# Patient Record
Sex: Female | Born: 1965 | Race: Black or African American | Hispanic: No | Marital: Married | Smoking: Never smoker
Health system: Southern US, Community
[De-identification: ages and names within clinical notes are randomized; demographics above are authoritative.]

## PROBLEM LIST (undated history)

## (undated) DIAGNOSIS — I10 Essential (primary) hypertension: Secondary | ICD-10-CM

---

## 1994-06-22 HISTORY — PX: TUBAL LIGATION: SHX77

## 2004-08-11 ENCOUNTER — Emergency Department: Payer: Self-pay | Admitting: General Practice

## 2008-05-06 ENCOUNTER — Emergency Department: Payer: Self-pay | Admitting: Emergency Medicine

## 2009-07-23 ENCOUNTER — Emergency Department: Payer: Self-pay | Admitting: Emergency Medicine

## 2015-01-28 ENCOUNTER — Other Ambulatory Visit: Payer: Self-pay | Admitting: Orthopedic Surgery

## 2015-01-28 DIAGNOSIS — M25551 Pain in right hip: Secondary | ICD-10-CM

## 2015-01-28 DIAGNOSIS — M898X5 Other specified disorders of bone, thigh: Secondary | ICD-10-CM

## 2015-01-28 DIAGNOSIS — G8929 Other chronic pain: Secondary | ICD-10-CM

## 2015-01-31 ENCOUNTER — Other Ambulatory Visit: Payer: Self-pay | Admitting: Orthopedic Surgery

## 2015-01-31 DIAGNOSIS — G8929 Other chronic pain: Secondary | ICD-10-CM

## 2015-01-31 DIAGNOSIS — M7989 Other specified soft tissue disorders: Secondary | ICD-10-CM

## 2015-01-31 DIAGNOSIS — M898X5 Other specified disorders of bone, thigh: Secondary | ICD-10-CM

## 2015-01-31 DIAGNOSIS — M25551 Pain in right hip: Secondary | ICD-10-CM

## 2015-02-01 ENCOUNTER — Ambulatory Visit
Admission: RE | Admit: 2015-02-01 | Discharge: 2015-02-01 | Disposition: A | Discharge status: Home / Self Care | Payer: BLUE CROSS/BLUE SHIELD | Source: Ambulatory Visit | Attending: Orthopedic Surgery | Admitting: Orthopedic Surgery

## 2015-02-01 ENCOUNTER — Other Ambulatory Visit
Admission: RE | Admit: 2015-02-01 | Discharge: 2015-02-01 | Disposition: A | Discharge status: Home / Self Care | Payer: BLUE CROSS/BLUE SHIELD | Source: Ambulatory Visit | Attending: Orthopedic Surgery | Admitting: Orthopedic Surgery

## 2015-02-01 DIAGNOSIS — M7989 Other specified soft tissue disorders: Secondary | ICD-10-CM

## 2015-02-01 DIAGNOSIS — M25551 Pain in right hip: Secondary | ICD-10-CM | POA: Insufficient documentation

## 2015-02-01 DIAGNOSIS — G8929 Other chronic pain: Secondary | ICD-10-CM | POA: Diagnosis present

## 2015-02-01 DIAGNOSIS — M7061 Trochanteric bursitis, right hip: Secondary | ICD-10-CM | POA: Diagnosis not present

## 2015-02-01 DIAGNOSIS — M799 Soft tissue disorder, unspecified: Secondary | ICD-10-CM | POA: Diagnosis present

## 2015-02-01 DIAGNOSIS — M898X5 Other specified disorders of bone, thigh: Secondary | ICD-10-CM

## 2015-02-01 LAB — CHLORIDE: CHLORIDE: 98 mmol/L — AB (ref 101–111)

## 2015-02-01 LAB — BUN: BUN: 15 mg/dL (ref 6–20)

## 2015-02-01 LAB — GLUCOSE, RANDOM: Glucose, Bld: 101 mg/dL — ABNORMAL HIGH (ref 65–99)

## 2015-02-01 LAB — POTASSIUM: Potassium: 3.1 mmol/L — ABNORMAL LOW (ref 3.5–5.1)

## 2015-02-01 LAB — CREATININE, SERUM
Creatinine, Ser: 0.82 mg/dL (ref 0.44–1.00)
GFR calc non Af Amer: >60 mL/min (ref >60)

## 2015-02-01 LAB — SODIUM: Sodium: 138 mmol/L (ref 135–145)

## 2015-02-01 MED ORDER — GADOBENATE DIMEGLUMINE 529 MG/ML IV SOLN
20.0000 mL | Freq: Once | INTRAVENOUS | Status: AC | PRN
  Administered 2015-02-01: 20 mL via INTRAVENOUS

## 2019-01-30 ENCOUNTER — Emergency Department (HOSPITAL_COMMUNITY)
Admission: EM | Admit: 2019-01-30 | Discharge: 2019-01-30 | Disposition: A | Discharge status: Home / Self Care | Payer: BLUE CROSS/BLUE SHIELD | Attending: Emergency Medicine | Admitting: Emergency Medicine

## 2019-01-30 ENCOUNTER — Encounter (HOSPITAL_COMMUNITY): Payer: Self-pay | Admitting: *Deleted

## 2019-01-30 ENCOUNTER — Emergency Department (HOSPITAL_COMMUNITY): Payer: BLUE CROSS/BLUE SHIELD

## 2019-01-30 ENCOUNTER — Other Ambulatory Visit: Payer: Self-pay

## 2019-01-30 DIAGNOSIS — R0789 Other chest pain: Secondary | ICD-10-CM | POA: Diagnosis present

## 2019-01-30 DIAGNOSIS — I10 Essential (primary) hypertension: Secondary | ICD-10-CM | POA: Diagnosis not present

## 2019-01-30 DIAGNOSIS — U071 COVID-19: Secondary | ICD-10-CM | POA: Insufficient documentation

## 2019-01-30 DIAGNOSIS — R0600 Dyspnea, unspecified: Secondary | ICD-10-CM | POA: Insufficient documentation

## 2019-01-30 HISTORY — DX: Essential (primary) hypertension: I10

## 2019-01-30 LAB — CBC WITH DIFFERENTIAL/PLATELET
Abs Immature Granulocytes: 0.02 10*3/uL (ref 0.00–0.07)
Basophils Absolute: 0 10*3/uL (ref 0.0–0.1)
Basophils Relative: 1 %
Eosinophils Absolute: 0.2 10*3/uL (ref 0.0–0.5)
Eosinophils Relative: 3 %
HCT: 39.5 % (ref 36.0–46.0)
Hemoglobin: 12.2 g/dL (ref 12.0–15.0)
Immature Granulocytes: 0 %
Lymphocytes Relative: 21 %
Lymphs Abs: 1.4 10*3/uL (ref 0.7–4.0)
MCH: 21.4 pg — ABNORMAL LOW (ref 26.0–34.0)
MCHC: 30.9 g/dL (ref 30.0–36.0)
MCV: 69.3 fL — ABNORMAL LOW (ref 80.0–100.0)
Monocytes Absolute: 0.6 10*3/uL (ref 0.1–1.0)
Monocytes Relative: 9 %
Neutro Abs: 4.3 10*3/uL (ref 1.7–7.7)
Neutrophils Relative %: 66 %
Platelets: 303 10*3/uL (ref 150–400)
RBC: 5.7 MIL/uL — ABNORMAL HIGH (ref 3.87–5.11)
RDW: 17.3 % — ABNORMAL HIGH (ref 11.5–15.5)
WBC: 6.4 10*3/uL (ref 4.0–10.5)
nRBC: 0 % (ref 0.0–0.2)

## 2019-01-30 LAB — BASIC METABOLIC PANEL
Anion gap: 10 (ref 5–15)
BUN: 14 mg/dL (ref 6–20)
CO2: 27 mmol/L (ref 22–32)
Calcium: 9.1 mg/dL (ref 8.9–10.3)
Chloride: 103 mmol/L (ref 98–111)
Creatinine, Ser: 0.67 mg/dL (ref 0.44–1.00)
GFR calc Af Amer: >60 mL/min (ref >60)
GFR calc non Af Amer: >60 mL/min (ref >60)
Glucose, Bld: 87 mg/dL (ref 70–99)
Potassium: 3.7 mmol/L (ref 3.5–5.1)
Sodium: 140 mmol/L (ref 135–145)

## 2019-01-30 LAB — TROPONIN I (HIGH SENSITIVITY)
Troponin I (High Sensitivity): <2 ng/L (ref <18)
Troponin I (High Sensitivity): <2 ng/L (ref <18)

## 2019-01-30 LAB — SARS CORONAVIRUS 2 BY RT PCR (HOSPITAL ORDER, PERFORMED IN ~~LOC~~ HOSPITAL LAB): SARS Coronavirus 2: POSITIVE — AB

## 2019-01-30 MED ORDER — IOHEXOL 350 MG/ML SOLN
100.0000 mL | Freq: Once | INTRAVENOUS | Status: AC | PRN
  Administered 2019-01-30: 100 mL via INTRAVENOUS

## 2019-01-30 NOTE — ED Provider Notes (Signed)
[facility]ENCY [facility]PARTMENT Provider Note   CSN: [id number] Arrival date & time: 01/30/19  1019     History   Chief Complaint Chief Complaint  Patient presents with   Chest Pain    HPI Michelle Burch is a 53 y.o. female.     HPI  Pt was seen at 1120. Per pt, c/o gradual onset and persistence of constant generalized chest "tightness" since yesterday approximately 2000. Pt states she has had waxing and waning generalized chest "tightness" over the past 3 weeks, just "worse" yesterday evening. Pt describes "worse" as "maybe wheezing" and "made be breathe deep." Pt states she had loss of taste/smell and this same chest pain 3 weeks ago (01/10/19) and a co-worker tested positive for COVID. Pt states she went for testing 7/28 and 7/30 was told she was +COVID. Pt states she may have had one episode (ie: one meal) of "I might have actually tasted the fish I was eating) but overall states she has had continued symptoms for 3 weeks. Pt states she is currently out of quarantine (13 days after +testing), but her PMD told her to come to the ED for further testing regarding her chest pain. Pt also states that her PMD spoke of retesting her for COVID. Denies any further positive contacts. Denies fevers at any time over the past 3 weeks, especially the past 3 days. Denies palpitations, no SOB/cough, no abd pain, no fevers, no rash, no back pain, no N/V/D, no visual changes, no focal motor weakness, no tingling/numbness in extremities, no ataxia, no slurred speech, no facial droop.       Past Medical History:  Diagnosis Date   Hypertension     There are no active problems to display for this patient.   History reviewed. No pertinent surgical history.   OB History   No obstetric history on file.      Home Medications    Prior to Admission medications   Medication Sig Start Date End Date Taking? Authorizing Provider  lisinopril-hydrochlorothiazide (ZESTORETIC) 20-25 MG tablet  Take 1 tablet by mouth daily. 01/08/19   [provider]    Family History History reviewed. No pertinent family history.  Social History Social History   Tobacco Use   Smoking status: Never Smoker   Smokeless tobacco: Never Used  Substance Use Topics   Alcohol use: Yes    Comment: occ   Drug use: Never     Allergies   Patient has no known allergies.   Review of Systems Review of Systems ROS: Statement: All systems negative except as marked or noted in the HPI; Constitutional: Negative for fever and chills. +loss of taste/smell.; ; Eyes: Negative for eye pain, redness and discharge. ; ; ENMT: Negative for ear pain, hoarseness, nasal congestion, sinus pressure and sore throat. ; ; Cardiovascular: +CP. Negative for palpitations, diaphoresis, dyspnea and peripheral edema. ; ; Respiratory: +"maybe wheezing," "breathing deep." Negative for cough and stridor. ; ; Gastrointestinal: Negative for nausea, vomiting, diarrhea, abdominal pain, blood in stool, hematemesis, jaundice and rectal bleeding. . ; ; Genitourinary: Negative for dysuria, flank pain and hematuria. ; ; Musculoskeletal: Negative for back pain and neck pain. Negative for swelling and trauma.; ; Skin: Negative for pruritus, rash, abrasions, blisters, bruising and skin lesion.; ; Neuro: Negative for headache, lightheadedness and neck stiffness. Negative for weakness, altered level of consciousness, altered mental status, extremity weakness, paresthesias, involuntary movement, seizure and syncope.       Physical Exam Updated Vital Signs BP  118/79    Pulse 64    Temp 98.2 F (36.8 C) (Oral)    Resp 15    Ht 5' (1.524 m)    Wt 98 kg    LMP  (LMP Unknown)    SpO2 98%    BMI 42.18 kg/m   Physical Exam 1125: Physical examination:  Nursing notes reviewed; Vital signs and O2 SAT reviewed;  Constitutional: Well developed, Well nourished, Well hydrated, In no acute distress; Head:  Normocephalic, atraumatic; Eyes: EOMI,  PERRL, No scleral icterus; ENMT: TM's clear bilat. +edemetous nasal turbinates bilat with clear rhinorrhea. Mouth and pharynx without lesions. No tonsillar exudates. No intra-oral edema. No submandibular or sublingual edema. No hoarse voice, no drooling, no stridor. No pain with manipulation of larynx. No trismus. Mouth and pharynx normal, Mucous membranes moist; Neck: Supple, Full range of motion, No lymphadenopathy; Cardiovascular: Regular rate and rhythm, No gallop; Respiratory: Breath sounds clear & equal bilaterally, No wheezes.  Speaking full sentences with ease, Normal respiratory effort/excursion; Chest: Nontender, Movement normal; Abdomen: Soft, Nontender, Nondistended, Normal bowel sounds; Genitourinary: No CVA tenderness; Extremities: Peripheral pulses normal, No tenderness, No edema, No calf edema or asymmetry.; Neuro: AA&Ox3, Major CN grossly intact. No facial droop. Speech clear. No gross focal motor or sensory deficits in extremities.; Skin: Color normal, Warm, Dry.     ED Treatments / Results  Labs (all labs ordered are listed, but only abnormal results are displayed)   EKG EKG Interpretation  Date/Time:  Monday January 30 2019 11:12:07 EDT Ventricular Rate:  71 PR Interval:    QRS Duration: 89 QT Interval:  364 QTC Calculation: 396 R Axis:   29 Text Interpretation:  Sinus rhythm Low voltage, precordial leads Baseline wander No old tracing to compare Confirmed by Samuel JesterMcManus, Nadav Swindell [phone]([id number]) on 01/30/2019 11:45:03 AM   Radiology   Procedures Procedures (including critical care time)  Medications Ordered in ED Medications - No data to display   Initial Impression / Assessment and Plan / ED Course  I have reviewed the triage vital signs and the nursing notes.  Pertinent labs & imaging results that were available during my care of the patient were reviewed by me and considered in my medical decision making (see chart for details).     MDM Reviewed: previous chart,  nursing note and vitals Reviewed previous: labs Interpretation: labs, ECG, x-ray and CT scan    Results for orders placed or performed during the hospital encounter of 01/30/19  SARS Coronavirus 2 [facility](Hospital order, Performed [facility]) Nasopharyngeal Nasopharyngeal Swab   Specimen: Nasopharyngeal Swab  Result Value Ref Range   SARS Coronavirus 2 POSITIVE (A) NEGATIVE  Basic metabolic panel  Result Value Ref Range   Sodium 140 135 - 145 mmol/L   Potassium 3.7 3.5 - 5.1 mmol/L   Chloride 103 98 - 111 mmol/L   CO2 27 22 - 32 mmol/L   Glucose, Bld 87 70 - 99 mg/dL   BUN 14 6 - 20 mg/dL   Creatinine, Ser 6.040.67 0.44 - 1.00 mg/dL   Calcium 9.1 8.9 - 54.010.3 mg/dL   GFR calc non Af Amer >60 >60 mL/min   GFR calc Af Amer >60 >60 mL/min   Anion gap 10 5 - 15  CBC with Differential  Result Value Ref Range   WBC 6.4 4.0 - 10.5 K/uL   RBC 5.70 (H) 3.87 - 5.11 MIL/uL   Hemoglobin 12.2 12.0 - 15.0 g/dL   HCT 98.139.5 19.136.0 - 47.846.0 %  MCV 69.3 (L) 80.0 - 100.0 fL   MCH 21.4 (L) 26.0 - 34.0 pg   MCHC 30.9 30.0 - 36.0 g/dL   RDW 17.3 (H) 11.5 - 15.5 %   Platelets 303 150 - 400 K/uL   nRBC 0.0 0.0 - 0.2 %   Neutrophils Relative % 66 %   Neutro Abs 4.3 1.7 - 7.7 K/uL   Lymphocytes Relative 21 %   Lymphs Abs 1.4 0.7 - 4.0 K/uL   Monocytes Relative 9 %   Monocytes Absolute 0.6 0.1 - 1.0 K/uL   Eosinophils Relative 3 %   Eosinophils Absolute 0.2 0.0 - 0.5 K/uL   Basophils Relative 1 %   Basophils Absolute 0.0 0.0 - 0.1 K/uL   RBC Morphology MICROCYTOSIS    Immature Granulocytes 0 %   Abs Immature Granulocytes 0.02 0.00 - 0.07 K/uL  Troponin I (High Sensitivity)  Result Value Ref Range   Troponin I (High Sensitivity) <2.0 <18 ng/L  Troponin I (High Sensitivity)  Result Value Ref Range   Troponin I (High Sensitivity) <2.0 <18 ng/L    Dg Chest Port 1 View Result Date: 01/30/2019 CLINICAL DATA:  Chest pain EXAM: PORTABLE CHEST 1 VIEW COMPARISON:  None. FINDINGS: There is no  edema or consolidation. Heart is upper normal in size with pulmonary vascularity normal. No adenopathy. No pneumothorax. No bone lesions. IMPRESSION: No edema or consolidation.  Heart upper normal in size. Electronically Signed   By: Lowella Grip III M.D.   On: 01/30/2019 12:32     Michelle Burch was evaluated in Emergency Department on 01/30/2019 for the symptoms described in the history of present illness. She was evaluated in the context of the global COVID-19 pandemic, which necessitated consideration that the patient might be at risk for infection with the SARS-CoV-2 virus that causes COVID-19. Institutional protocols and algorithms that pertain to the evaluation of patients at risk for COVID-19 are in a state of rapid change based on information released by regulatory bodies including the CDC and federal and state organizations. These policies and algorithms were followed during the patient's care in the ED.    1140:  Current CDC guidelines do not recommend repeat COVID testing. Pt will however need CTA chest to r/o PE and pneumonia, as well as high sensitivity troponin and EKG. T/C returned from [facility] ID Dr. Drucilla Schmidt, case discussed, including:  HPI, pertinent PM/SHx, VS/PE, dx testing, ED course and treatment: recommends to obtain repeat COVID testing, and agrees with EDP plan to r/o PE and pneumonia with CT chest.   1500:   Doubt ACS as cause for symptoms with normal troponin x2 and unchanged EKG from previous after 3 weeks of constant symptoms, and 2 days of "worsening" constant symptoms. Pt without O2 requirement. CTA chest pending.  Sign out to Dr. Wilson Singer.      Final Clinical Impressions(s) / ED Diagnoses   Final diagnoses:  None    ED Discharge Orders    None       Francine Graven, DO 01/30/19 1501

## 2019-01-30 NOTE — ED Triage Notes (Signed)
Pt tested positive for covid on 01/19/19.  Chest pressure started yesterday. Denies retest for covid. Denies fevers at home. + sob mild per pt

## 2019-01-30 NOTE — Discharge Instructions (Addendum)
Your CT scan does not show pulmonary emboli (blood clots). There are some mild changes in your lung which could reflect atelectasis (area of lung not fully expanded when the images were taken) or mild changes related to COVID. Regardless, it overall looks reassuring. Your COVID testing is still positive. Since you are still having symptoms that could potentially be from the SARS-CoV-2 virus, you need to quarantine for an additional 7 days.

## 2019-08-23 ENCOUNTER — Other Ambulatory Visit (HOSPITAL_COMMUNITY): Payer: Self-pay | Admitting: Internal Medicine

## 2019-08-23 ENCOUNTER — Ambulatory Visit (HOSPITAL_COMMUNITY)
Admission: RE | Admit: 2019-08-23 | Discharge: 2019-08-23 | Disposition: A | Discharge status: Home / Self Care | Payer: Commercial Managed Care - PPO | Source: Ambulatory Visit | Attending: Internal Medicine | Admitting: Internal Medicine

## 2019-08-23 ENCOUNTER — Other Ambulatory Visit: Payer: Self-pay

## 2019-08-23 DIAGNOSIS — Z1231 Encounter for screening mammogram for malignant neoplasm of breast: Secondary | ICD-10-CM

## 2019-09-04 ENCOUNTER — Telehealth: Payer: Self-pay | Admitting: Obstetrics and Gynecology

## 2019-09-04 NOTE — Telephone Encounter (Signed)

## 2019-09-05 ENCOUNTER — Encounter: Payer: Self-pay | Admitting: Obstetrics and Gynecology

## 2019-09-05 ENCOUNTER — Other Ambulatory Visit: Payer: Self-pay

## 2019-09-05 ENCOUNTER — Ambulatory Visit (INDEPENDENT_AMBULATORY_CARE_PROVIDER_SITE_OTHER): Payer: Commercial Managed Care - PPO | Admitting: Obstetrics and Gynecology

## 2019-09-05 VITALS — BP 134/79 | HR 80 | Ht 60.0 in | Wt 213.8 lb

## 2019-09-05 DIAGNOSIS — Z658 Other specified problems related to psychosocial circumstances: Secondary | ICD-10-CM | POA: Diagnosis not present

## 2019-09-05 DIAGNOSIS — N951 Menopausal and female climacteric states: Secondary | ICD-10-CM

## 2019-09-05 MED ORDER — ESTRADIOL 1 MG PO TABS: 1.0000 mg | ORAL_TABLET | Freq: Every day | ORAL | 12 refills | Status: DC

## 2019-09-05 MED ORDER — ESTRADIOL 1 MG PO TABS: 1.0000 mg | ORAL_TABLET | Freq: Every day | ORAL | 12 refills | Status: AC

## 2019-09-05 NOTE — Progress Notes (Addendum)
Patient ID: Michelle Burch, female   DOB: [birth year], 54 y.o.   MRN: [id number]    [facility] Visit  @DATE @            Patient name: Michelle Burch MRN Michelle Burch  Date of birth: 1965/09/19  CC & HPI:  Michelle Burch is a 54 y.o. female NEW PATIENT REFERRED BY DR. 44 presenting today to discuss menopause. She reports that she has been going through menopause since 2017. She has been experiencing hot flashes, mood swings, and night sweats. She has not had a period since 2017. The patient is here today because she would like medication for her mood swings and to improve her sex drive.   Social Hx The patient's marriage has ended at a result of this and she has been feeling very depressed. Her and her wife live in the same house, but the patient is moving out next month. They got married in 2018 but the relationship went downhill about a year later due to her symptoms. She denies any physical violence in the relationship. The patient does not have a good support system and is having trouble focusing at work.   The patient has been going to [facility] for her gynecological care and her most recent PAP in 02/2019 was normal. The patient denies fever, chills or any other symptoms or complaints at this time.   ROS:  ROS + mood swings + decreased sex drive - fever - chills All systems are negative except as noted in the HPI and PMH.   Pertinent History Reviewed:   Reviewed: Medical         Past Medical History:  Diagnosis Date  . Hypertension                               Surgical Hx:    Past Surgical History:  Procedure Laterality Date  . TUBAL LIGATION  1996   Medications: Reviewed & Updated - see associated section                       Current Outpatient Medications:  .  lisinopril-hydrochlorothiazide (ZESTORETIC) 20-25 MG tablet, Take 1 tablet by mouth daily., Disp: , Rfl:    Social History: Reviewed -  reports that she has never smoked. She has never used  smokeless tobacco.  Objective Findings:  Vitals: Blood pressure 134/79, pulse 80, height 5' (1.524 m), weight 213 lb 12.8 oz (97 kg).  PHYSICAL EXAMINATION General appearance - Tearful, overweight Mental status - oriented to person, place, and time. depressed mood  PELVIC DEFERRED  Assessment & Plan:   A:  1.  Vasomotor symptoms of menopause 2.  Depression 3.  Acute grief reaction  P:  1.  Rx Estradiol 2.  Give information about [employer] 3.  F/U in 6 weeks   By signing my name below, I, 03/2019, attest that this documentation has been prepared under the direction and in the presence of Pietro Cassis, MD. Electronically Signed: Tilda Burrow, Medical Scribe. 09/05/19. 4:43 PM.  I personally performed the services described in this documentation, which was SCRIBED in my presence. The recorded information has been reviewed and considered accurate. It has been edited as necessary during review. 09/07/19, MD

## 2019-09-06 DIAGNOSIS — N951 Menopausal and female climacteric states: Secondary | ICD-10-CM | POA: Insufficient documentation

## 2019-09-06 DIAGNOSIS — Z658 Other specified problems related to psychosocial circumstances: Secondary | ICD-10-CM | POA: Insufficient documentation

## 2019-09-06 NOTE — Patient Instructions (Signed)
[employer] is a support organization for women in crisis in [place], [phone].

## 2019-10-16 ENCOUNTER — Telehealth: Payer: Self-pay | Admitting: Obstetrics & Gynecology

## 2019-10-16 NOTE — Telephone Encounter (Signed)

## 2019-10-17 ENCOUNTER — Ambulatory Visit: Payer: Commercial Managed Care - PPO | Admitting: Obstetrics & Gynecology

## 2020-11-02 IMAGING — MG DIGITAL SCREENING BILAT W/ TOMO W/ CAD
8 series · 8 of 24 positions shown · non-contrast
Comparison: None.

CLINICAL DATA: Screening.

EXAM:
DIGITAL SCREENING BILATERAL MAMMOGRAM WITH TOMO AND CAD

[R CC synth-2D]
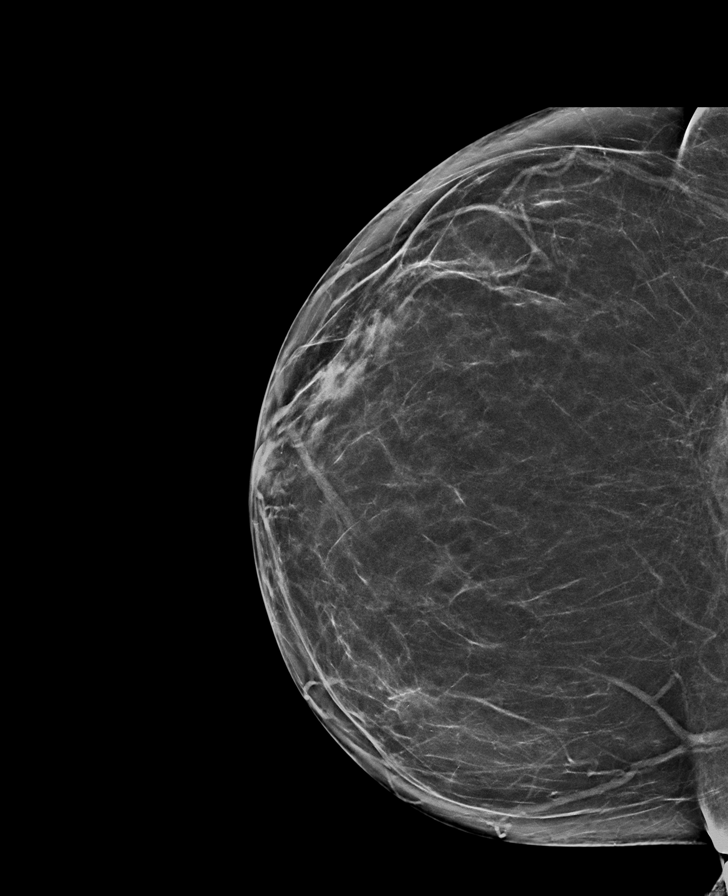

[R MLO synth-2D]
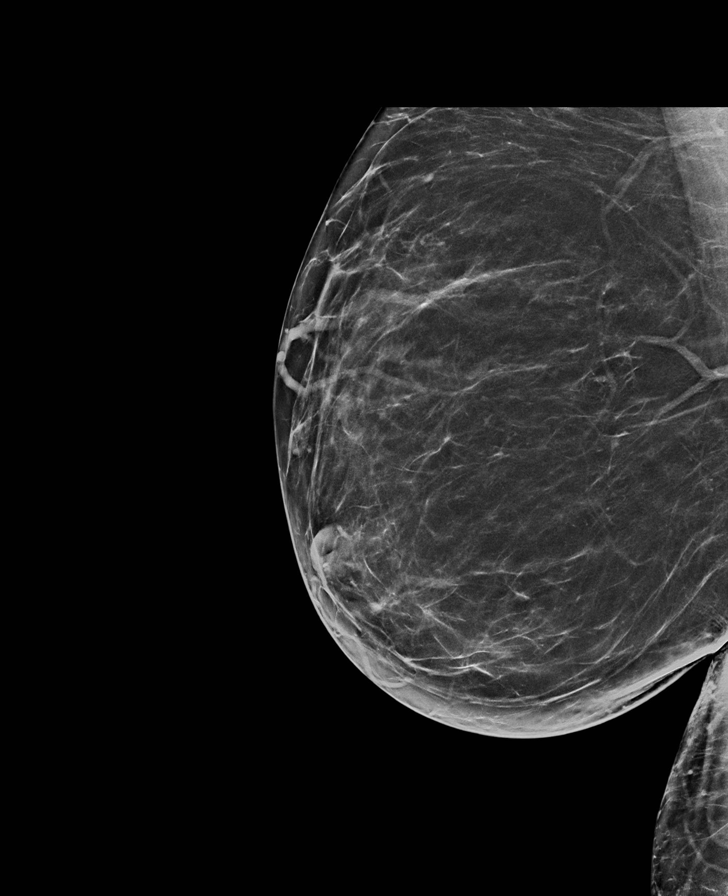

[L CC synth-2D]
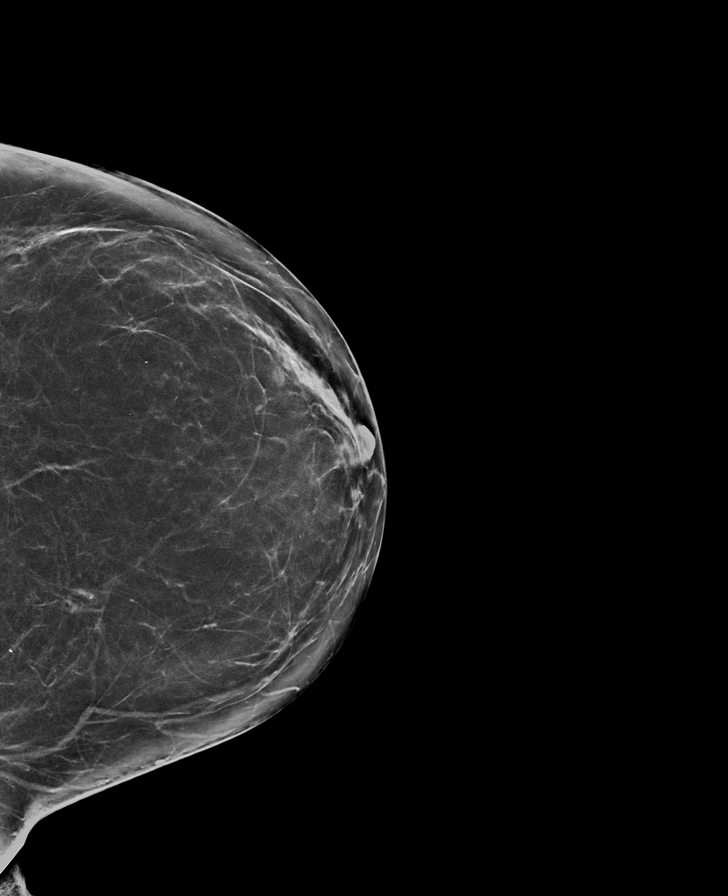

[L MLO synth-2D]
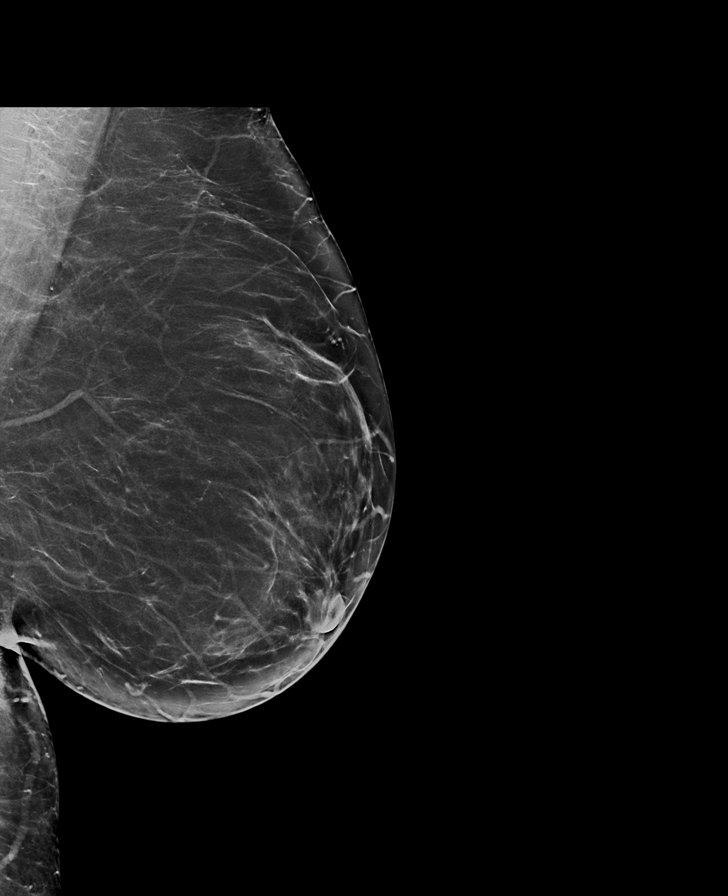

[R MLO tomo · tomo slice 40/79.0]
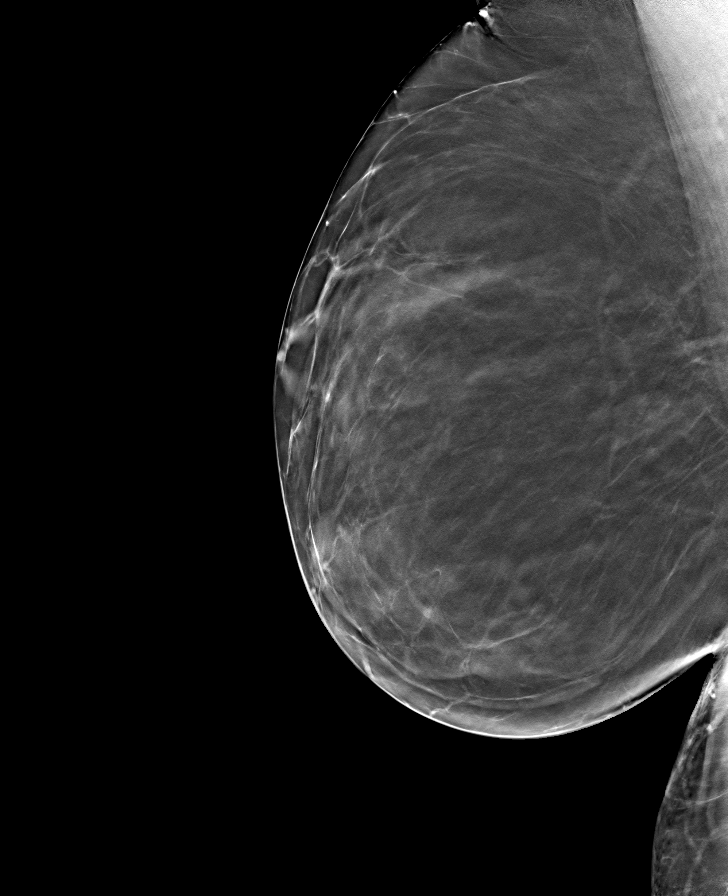

[L CC tomo · tomo slice 35/70.0]
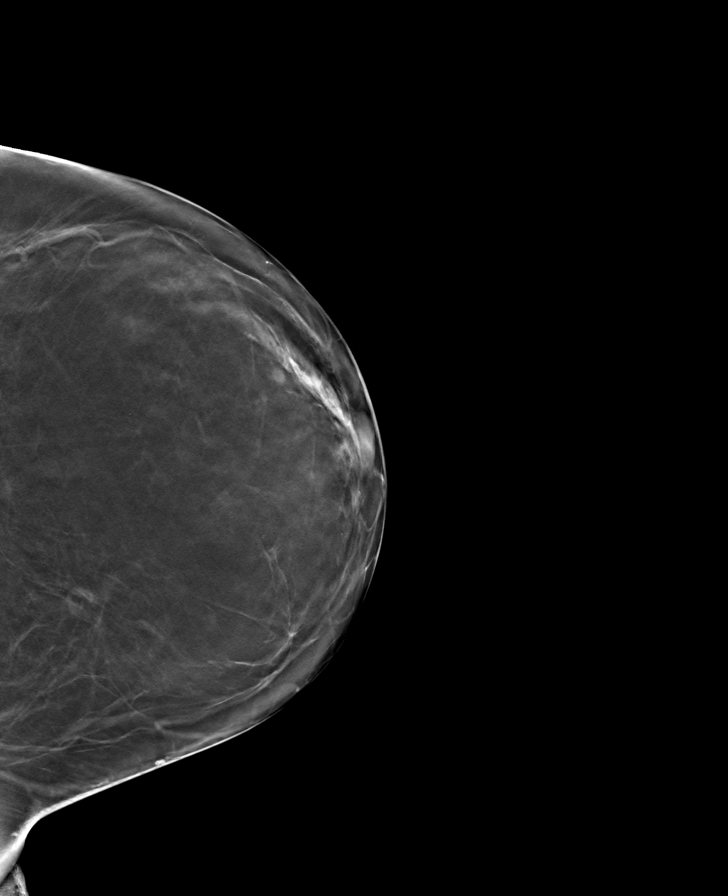

[L MLO tomo · tomo slice 40/79.0]
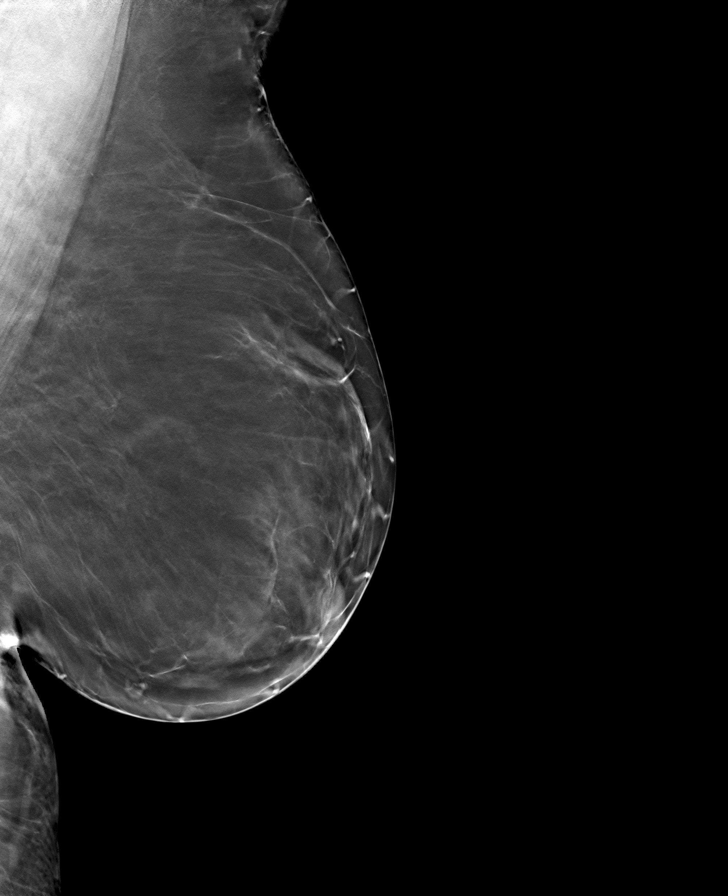

[R CC tomo · tomo slice 37/74.0]
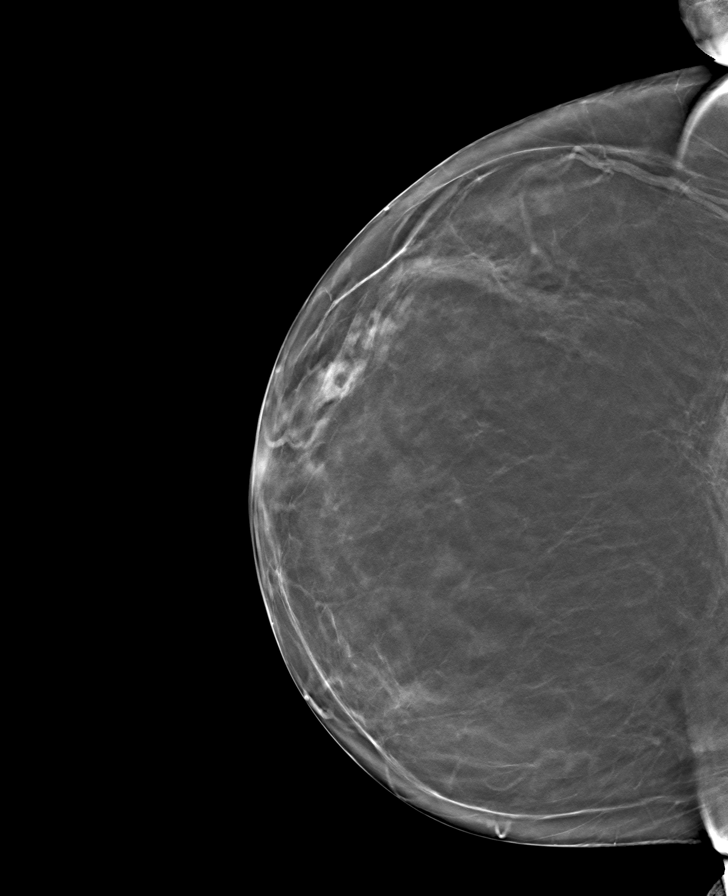

[8 of 24 positions shown; findings below may reference images not displayed]

ACR Breast Density Category b: There are scattered areas of
fibroglandular density.
FINDINGS: There are no findings suspicious for malignancy. Images were
processed with CAD.
IMPRESSION: No mammographic evidence of malignancy. A result letter of this
screening mammogram will be mailed directly to the patient.

RECOMMENDATION:
Screening mammogram in one year. (Code:Y5-G-EJ6)

BI-RADS CATEGORY  1: Negative.

## 2021-07-21 ENCOUNTER — Other Ambulatory Visit: Payer: Self-pay | Admitting: Family Medicine

## 2021-07-21 DIAGNOSIS — Z1231 Encounter for screening mammogram for malignant neoplasm of breast: Secondary | ICD-10-CM

## 2021-07-31 LAB — COLOGUARD: COLOGUARD: NEGATIVE

## 2021-07-31 LAB — EXTERNAL GENERIC LAB PROCEDURE: COLOGUARD: NEGATIVE

## 2023-09-20 ENCOUNTER — Other Ambulatory Visit: Payer: Self-pay | Admitting: Student

## 2023-09-20 ENCOUNTER — Encounter: Payer: Self-pay | Admitting: Student

## 2023-09-20 DIAGNOSIS — Z1231 Encounter for screening mammogram for malignant neoplasm of breast: Secondary | ICD-10-CM

## 2023-11-17 ENCOUNTER — Ambulatory Visit
Admission: RE | Admit: 2023-11-17 | Discharge: 2023-11-17 | Disposition: A | Discharge status: Home / Self Care | Source: Ambulatory Visit | Attending: Student | Admitting: Student

## 2023-11-17 DIAGNOSIS — Z1231 Encounter for screening mammogram for malignant neoplasm of breast: Secondary | ICD-10-CM | POA: Insufficient documentation

## 2023-11-22 ENCOUNTER — Encounter: Payer: Self-pay | Admitting: Student

## 2023-11-22 ENCOUNTER — Other Ambulatory Visit: Payer: Self-pay | Admitting: Family Medicine

## 2023-11-22 DIAGNOSIS — R928 Other abnormal and inconclusive findings on diagnostic imaging of breast: Secondary | ICD-10-CM

## 2023-11-30 ENCOUNTER — Ambulatory Visit
Admission: RE | Admit: 2023-11-30 | Discharge: 2023-11-30 | Disposition: A | Discharge status: Home / Self Care | Source: Ambulatory Visit | Attending: Family Medicine | Admitting: Family Medicine

## 2023-11-30 DIAGNOSIS — R928 Other abnormal and inconclusive findings on diagnostic imaging of breast: Secondary | ICD-10-CM
